# Patient Record
Sex: Female | Born: 1945 | Race: Black or African American | Hispanic: No
Health system: Southern US, Community
[De-identification: ages and names within clinical notes are randomized; demographics above are authoritative.]

---

## 2008-07-07 ENCOUNTER — Emergency Department: Payer: Self-pay | Admitting: Unknown Physician Specialty
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
## 2012-07-09 ENCOUNTER — Ambulatory Visit: Payer: Self-pay | Admitting: Ophthalmology

## 2012-07-09 LAB — CBC
HGB: 11.8 g/dL — ABNORMAL LOW (ref 12.0–16.0)
MCH: 30.2 pg (ref 26.0–34.0)
MCV: 87 fL (ref 80–100)
RBC: 3.91 10*6/uL (ref 3.80–5.20)

## 2012-07-11 ENCOUNTER — Ambulatory Visit: Payer: Self-pay | Admitting: Ophthalmology

## 2014-10-17 NOTE — Op Note (Signed)
PATIENT NAME:  Cindy [name], Cindy [name] MR#:  [id number] DATE OF BIRTH:  [birth year]  DATE OF PROCEDURE:  07/11/2012  PROCEDURE PERFORMED:  1.  Pars plana vitrectomy of the right eye.  2.  Panretinal photocoagulation of the right eye.  3.  Epiretinal membrane peel of the right eye.   PREOPERATIVE DIAGNOSES: 1.  Dense vitreous hemorrhage.  2.  Proliferative diabetic retinopathy.   POSTOPERATIVE DIAGNOSES: 1.  Dense vitreous hemorrhage.  2.  Epiretinal membrane with retinal edema.  PRIMARY SURGERY:   Aron BabaMatthew Landynn Dupler, M.D.   ANESTHESIA: Retrobulbar block of the right eye with monitored anesthesia care.   COMPLICATIONS: None.   ESTIMATED BLOOD LOSS: Less than 1 mL.  INDICATION FOR PROCEDURE: The patient had presented to my office with an extremely long period (several months) of loss of vision in the right eye. Examination revealed a dense vitreous hemorrhage of the right eye with no view. Ultrasound revealed no signs of retinal detachment. Risks, benefits and alternatives of the above procedure were discussed and the patient wished to proceed.   DETAILS OF PROCEDURE: After informed consent was obtained, the patient was brought into [facility] operative [facility]. The patient was placed in supine position, was given a small dose of Alfenta and a retrobulbar block was performed on the right eye by the primary surgeon without any complications. The right eye was prepped and draped in sterile manner. After lid speculum was inserted, a 23-gauge trocar was placed inferotemporally through displaced conjunctiva 4 mm beyond the limbus in an oblique fashion. The infusion cannula was turned on and inserted through the trocar and secured in position with Steri-Strips. Two more trocars were placed in a similar fashion superotemporally and superonasally. The vitreous cutter and light pipe were introduced in the eye and a core vitrectomy was performed. Extreme care was taken to slowly  progress from the anterior to the as the vitreous hemorrhage cleared. Once the retina was in view, multiple areas of proliferative plaques were identified. There was no sign of tractional retinal detachment and the vitreous body appeared to be completely detached from the retina and proliferative plaques. A few of the vitreous plaques were noted to be exerting traction. All of the plaques appeared to be very old and extremely adherent.   Vertical forceps were introduced and the areas of traction released from each other causing relaxation in the retina. A significant epiretinal membrane was identified and intraocular forceps were introduced and the epiretinal membrane was peeled from the macula without any complications. Attempts were made at peeling of the proliferative plaques. This was aborted secondary to extreme adhesion that was identified and concern over the creation of multiple retinal tears. The endolaser was then introduced.  There was already significant laser inferiorly with very little laser superiorly.  Panretinal photocoagulation was performed for 360 degrees in order to supplement this for a total of 1192 pulses. Once this was completed, an air-fluid exchange was performed. The trocars were removed and were closed using transconjunctival 6-0 plain gut sutures. The pressure in the eye was confirmed to be approximately 10 to 15 mmHg.  The optic nerve had been noted to be glaucomatous during the procedure. 5 mg of dexamethasone was given into the inferior fornix. The lid speculum was removed. The eye was cleaned. Cosopt and TobraDex were placed on the eye and a patch and shield were placed over the eye. The patient was taken to postanesthesia care with instructions to remain head up.  ____________________________ Ignacia Felling. Champ Mungo, MD mfa:ct D: 07/11/2012 08:24:53 ET [name]: 07/11/2012 10:40:43 ET JOB#: [id number]  cc: Ignacia Felling. Champ Mungo, MD, <Dictator> Cline Cools  MD ELECTRONICALLY SIGNED 07/18/2012 9:59
